# Patient Record
Sex: Male | Born: 1965 | Race: Black or African American | Hispanic: No | Marital: Single
Health system: Southern US, Community
[De-identification: ages and names within clinical notes are randomized; demographics above are authoritative.]

---

## 2009-10-27 ENCOUNTER — Encounter (INDEPENDENT_AMBULATORY_CARE_PROVIDER_SITE_OTHER): Payer: Self-pay | Admitting: Internal Medicine

## 2009-10-27 ENCOUNTER — Inpatient Hospital Stay (HOSPITAL_COMMUNITY): Admission: EM | Admit: 2009-10-27 | Discharge: 2009-10-29 | Payer: Self-pay | Admitting: Emergency Medicine

## 2009-10-27 ENCOUNTER — Ambulatory Visit: Payer: Self-pay | Admitting: Cardiovascular Disease

## 2010-09-23 LAB — COMPREHENSIVE METABOLIC PANEL
ALT: 27 U/L (ref 0–53)
AST: 27 U/L (ref 0–37)
Albumin: 3.6 g/dL (ref 3.5–5.2)
Alkaline Phosphatase: 61 U/L (ref 39–117)
Chloride: 106 mEq/L (ref 96–112)
Creatinine, Ser: 1.82 mg/dL — ABNORMAL HIGH (ref 0.4–1.5)
Total Bilirubin: 0.8 mg/dL (ref 0.3–1.2)

## 2010-09-23 LAB — BASIC METABOLIC PANEL
BUN: 17 mg/dL (ref 6–23)
CO2: 27 mEq/L (ref 19–32)
Calcium: 8.9 mg/dL (ref 8.4–10.5)
Calcium: 8.9 mg/dL (ref 8.4–10.5)
Chloride: 104 mEq/L (ref 96–112)
Creatinine, Ser: 1.68 mg/dL — ABNORMAL HIGH (ref 0.4–1.5)
GFR calc Af Amer: 54 mL/min — ABNORMAL LOW (ref >60)
GFR calc non Af Amer: 47 mL/min — ABNORMAL LOW (ref >60)
Glucose, Bld: 102 mg/dL — ABNORMAL HIGH (ref 70–99)
Glucose, Bld: 143 mg/dL — ABNORMAL HIGH (ref 70–99)
Potassium: 3.7 mEq/L (ref 3.5–5.1)
Sodium: 140 mEq/L (ref 135–145)
Sodium: 140 mEq/L (ref 135–145)

## 2010-09-23 LAB — DIFFERENTIAL
Basophils Relative: 1 % (ref 0–1)
Eosinophils Absolute: 0.4 10*3/uL (ref 0.0–0.7)
Eosinophils Relative: 6 % — ABNORMAL HIGH (ref 0–5)
Lymphs Abs: 2.4 10*3/uL (ref 0.7–4.0)
Monocytes Absolute: 0.7 10*3/uL (ref 0.1–1.0)
Monocytes Relative: 11 % (ref 3–12)
Neutro Abs: 2.4 10*3/uL (ref 1.7–7.7)
Neutrophils Relative %: 40 % — ABNORMAL LOW (ref 43–77)

## 2010-09-23 LAB — CBC
HCT: 43.4 % (ref 39.0–52.0)
MCV: 94.9 fL (ref 78.0–100.0)
MCV: 95.3 fL (ref 78.0–100.0)
Platelets: 135 10*3/uL — ABNORMAL LOW (ref 150–400)
Platelets: 138 10*3/uL — ABNORMAL LOW (ref 150–400)
RBC: 4.59 MIL/uL (ref 4.22–5.81)
RDW: 14.6 % (ref 11.5–15.5)
WBC: 6 10*3/uL (ref 4.0–10.5)
WBC: 7.6 10*3/uL (ref 4.0–10.5)

## 2010-09-23 LAB — CARDIAC PANEL(CRET KIN+CKTOT+MB+TROPI)
CK, MB: 3.6 ng/mL (ref 0.3–4.0)
CK, MB: 4 ng/mL (ref 0.3–4.0)
Total CK: 270 U/L — ABNORMAL HIGH (ref 7–232)
Troponin I: 0.03 ng/mL (ref 0.00–0.06)

## 2010-09-23 LAB — POCT CARDIAC MARKERS: Myoglobin, poc: 98.7 ng/mL (ref 12–200)

## 2010-09-23 LAB — LIPID PANEL
Cholesterol: 132 mg/dL (ref 0–200)
LDL Cholesterol: 77 mg/dL (ref 0–99)
Triglycerides: 40 mg/dL (ref <150)
VLDL: 8 mg/dL (ref 0–40)

## 2014-12-11 ENCOUNTER — Other Ambulatory Visit: Payer: Self-pay | Admitting: Family Medicine

## 2014-12-11 ENCOUNTER — Ambulatory Visit
Admission: RE | Admit: 2014-12-11 | Discharge: 2014-12-11 | Disposition: A | Discharge status: Home / Self Care | Payer: Self-pay | Source: Ambulatory Visit | Attending: Family Medicine | Admitting: Family Medicine

## 2014-12-11 DIAGNOSIS — M25531 Pain in right wrist: Secondary | ICD-10-CM

## 2014-12-11 DIAGNOSIS — M25532 Pain in left wrist: Secondary | ICD-10-CM

## 2015-02-07 ENCOUNTER — Encounter: Payer: Self-pay | Admitting: Occupational Therapy

## 2015-02-07 ENCOUNTER — Ambulatory Visit: Payer: 59 | Attending: Orthopedic Surgery | Admitting: Occupational Therapy

## 2015-02-07 DIAGNOSIS — M25531 Pain in right wrist: Secondary | ICD-10-CM | POA: Insufficient documentation

## 2015-02-07 DIAGNOSIS — R278 Other lack of coordination: Secondary | ICD-10-CM | POA: Diagnosis present

## 2015-02-07 DIAGNOSIS — M25532 Pain in left wrist: Secondary | ICD-10-CM | POA: Insufficient documentation

## 2015-02-07 NOTE — Therapy (Signed)
[facility] [address] [place], [place], [address] Phone: [phone]   Fax:  [phone]  Occupational Therapy Evaluation  Patient Details  Name: Roger Sheppard MRN: [id number] Date of Birth: 12/16/1965 Referring Provider:  Myrtie Neither, MD  Encounter Date: 02/07/2015      OT End of Session - 02/07/15 1229    Visit Number 1   Number of Visits 18   Authorization Type [employer]   OT Start Time 0930   OT Stop Time 1010   OT Time Calculation (min) 40 min   Activity Tolerance Patient tolerated treatment well      History reviewed. No pertinent past medical history.  History reviewed. No pertinent past surgical history.  There were no vitals filed for this visit.  Visit Diagnosis:  Pain in both wrists - Plan: Ot plan of care cert/re-cert  Coordination impairment - Plan: Ot plan of care cert/re-cert      Subjective Assessment - 02/07/15 0940    Currently in Pain? Yes   Pain Score 8   goes up to 10 with movement and lifting   Pain Location Wrist   Pain Orientation Right   Pain Descriptors / Indicators Stabbing   Pain Type Chronic pain   Pain Onset More than a month ago  3 months   Pain Frequency Intermittent   Aggravating Factors  lifting, moving; pt states he can also get intermittent swelling in wrist.   Pain Relieving Factors heat, rest, tens unit.  They didn't give me any pain meds. Pt states friend loaned him tens unit   Multiple Pain Sites Yes   Pain Score 6  increases to 10 with movement and lifting   Pain Location Wrist   Pain Orientation Left   Pain Descriptors / Indicators Stabbing   Pain Type Chronic pain   Pain Onset More than a month ago   Pain Frequency Intermittent   Aggravating Factors  lifting, moving   Pain Relieving Factors heat, tens, rest.            [facility] OT Assessment - 02/07/15 0001    Assessment   Diagnosis dequervain's bilateral wrists   Onset Date --  May  2016   Prior Therapy No prior therapies   Precautions   Precautions Other (comment)  pt reports MD stated no lifting beyond 10 pounds   Restrictions   Weight Bearing Restrictions No   Balance Screen   Has the patient fallen in the past 6 months No   Home  Environment   Family/patient expects to be discharged to: Private residence   Living Arrangements Alone   Type of Home House   Home Layout One level   Prior Function   Level of Independence Independent   Vocation Full time employment   Vocation Requirements works as  Administrator, 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   ADL   Eating/Feeding Modified independent  hard to cut and painful   Grooming Independent   Comptroller - Conservator, museum/gallery -  Tour manager Independent   IADL   Shopping Takes care of all shopping needs independently   Light Housekeeping Performs light daily tasks such as dishwashing, bed making  hard to do things that requires resistance or lifting  Meal Prep Plans, prepares and serves adequate meals independently  hard to meal prep due to pain   Engineer, agricultural independently on public transportation   Medication Management Is responsible for taking medication in correct dosages at correct time   Development worker, community financial matters independently (budgets, writes checks, pays rent, bills goes to bank), collects and keeps track of income   Mobility   Mobility Status Independent   Written Expression   Dominant Hand Right   Handwriting 100% legible   Sensation   Light Touch Appears Intact   Hot/Cold Appears Intact   Coordination   Gross Motor Movements are Fluid and Coordinated Yes   9 Hole Peg Test Right;Left   Right 9 Hole Peg Test 29.28   Left 9 Hole  Peg Test 35.36   Edema   Edema pt reports intermittent edema in right wrists intermittent   ROM / Strength   AROM / PROM / Strength AROM;Strength   AROM   Overall AROM  Within functional limits for tasks performed  positive test for dequarvains in left wrist only   Overall AROM Comments Pt reports signficant pain with radial and    Hand Function   Right Hand Gross Grasp Functional   Right Hand Grip (lbs) 95 pounds  increase pain   Left Hand Gross Grasp Functional   Left Hand Grip (lbs) 95 pounds  increased pain                           OT Short Term Goals - 02/07/15 1009    OT SHORT TERM GOAL #1   Title Pt will be mod I with HEP - 03/07/2015   Status New   OT SHORT TERM GOAL #2   Title Pt will report pain no greater than 5/10 with light activity   Status New   OT SHORT TERM GOAL #3   Title Pt will demonstrate improved coordination as evidenced by decrease in 9 hole peg test by 2 seconds (R= 29.28, L= 35.36)   Status New   OT SHORT TERM GOAL #4   Title Pt will report greater ease in using hands for cutting   Status New           OT Long Term Goals - 02/07/15 1013    OT LONG TERM GOAL #1   Title Pt will be mod I with upgraded HEP prn - 04/04/2015   Status New   OT LONG TERM GOAL #2   Title Pt will report no greater than 3/10 wrist pain with light activity   Status New   OT LONG TERM GOAL #3   Title Pt will demonstrate improved coordination as evidenced by decreasing time on 9 hole peg by 3 seconds (R= 29.28, L= 35.36)   Status New   OT LONG TERM GOAL #4   Title Pt will report ability to complete light household tasks with no greater than 3/10 wrist pain.   Status New               Plan - 02/07/15 1224    Clinical Impression Statement Pt is a 49 year old male with diagnosis of Dequarvain's bilateral wrist.  Pt presents today with the following deficits that impact his ability to complete ADL's  and IADL's without pain as well as inability to  return to work (pt works in Surveyor, quantity heavy boxes all day).:  significant pain in both wrists both at rest  and with activity, decreased coordination, at risk for potential loss of ROM due to pain, decreased functional use of both hands.   Rehab Potential Good   OT Frequency 2x / week   OT Duration 8 weeks   OT Treatment/Interventions Sheppard-care/ADL training;Biofeedback;Ultrasound;Traction;Moist Heat;Electrical Stimulation;Iontophoresis;Parrafin;Fluidtherapy;Contrast Bath;DME and/or AE instruction;Therapeutic exercise;Manual Therapy;Passive range of motion;Splinting;Therapeutic activities;Patient/family education   Plan focus on pain reduction, if possible initiate HEP   Consulted and Agree with Plan of Care Patient        Problem List There are no active problems to display for this patient.   Norton Pastel, OTR/L 02/07/2015, 12:33 PM  [facility] [address] [place], [place], [address] Phone: [phone]   Fax:  [phone]

## 2015-02-08 ENCOUNTER — Ambulatory Visit: Payer: 59 | Admitting: Occupational Therapy

## 2015-02-11 ENCOUNTER — Ambulatory Visit: Payer: 59 | Admitting: Occupational Therapy

## 2015-02-11 ENCOUNTER — Encounter: Payer: Self-pay | Admitting: Occupational Therapy

## 2015-02-11 DIAGNOSIS — M25531 Pain in right wrist: Secondary | ICD-10-CM

## 2015-02-11 DIAGNOSIS — M25532 Pain in left wrist: Secondary | ICD-10-CM

## 2015-02-11 DIAGNOSIS — R278 Other lack of coordination: Secondary | ICD-10-CM

## 2015-02-11 NOTE — Patient Instructions (Signed)
Home Program:  Work within your limits of pain. We are trying to work toward increased pain free range of motion and increased stability in your wrists.   Use heat prior to exercise and ice after.  Range of Motion: 1. Sit at a table. Turn hands so palms are facing each other. Do one hand at a time. DO THIS GENTLY!!  First flex wrist toward your belly then extend wrist away from your belly. Do 10 times. Do 1-2 times per day.   Wrist Stabilization exercises: 1. Do each hand separately.  Make sure you start with your wrist in neutral and elbow supported.  Using your other hand apply pressure with two fingers to top, then underneath, then each side.  Hold resistance for count of 5 for reach repetition.  Do 5 repetitions in each direction. Do this 3 times per day.

## 2015-02-11 NOTE — Therapy (Signed)
[facility] [address] [place], [place], [address] Phone: [phone]   Fax:  [phone]  Occupational Therapy Treatment  Patient Details  Name: Roger Sheppard MRN: [id number] Date of Birth: 12/22/1965 Referring Provider:  Myrtie Neither, MD  Encounter Date: 02/11/2015      OT End of Session - 02/11/15 1038    Visit Number 2   Number of Visits 16   Authorization Type [employer]   OT Start Time [phone]   OT Stop Time 0931   OT Time Calculation (min) 44 min   Activity Tolerance Patient tolerated treatment well      History reviewed. No pertinent past medical history.  History reviewed. No pertinent past surgical history.  There were no vitals filed for this visit.  Visit Diagnosis:  Pain in both wrists  Coordination impairment      Subjective Assessment - 02/11/15 0849    Subjective  The heat really helped the pain (after fluidotherapy)   Pertinent History see epic snapshot   Patient Stated Goals some help with my wrists   Currently in Pain? Yes   Pain Score 7    Pain Location Wrist   Pain Orientation Right;Left  both wrists   Pain Descriptors / Indicators Stabbing   Pain Type Chronic pain   Pain Onset More than a month ago   Pain Frequency Intermittent   Aggravating Factors  lifting ,moving, ptt sttes he can also get some intermitent swelling in wrist   Pain Relieving Factors heat, rest, tens unit.                       OT Treatments/Exercises (OP) - 02/11/15 0001    Exercises   Exercises Wrist   Wrist Exercises   Wrist Flexion PROM;AROM   Wrist Flexion Limitations Initially provided PROM to both wrists of flexion in gravity eliminated position to determine pain limitations. Pt able to tolerated. Followed by AROM in same position and pt instructed to complete gently and within pain limits. Pt able to tolerate   Wrist Extension PROM;AROM   Wrist Extension Limitations PROM to  both wrists in gravity eliminated position to determine pain tolerance. Then followed by AROM with instruction for gentle ROM and within pain limits. Pt able to tolerate.   Other wrist exercises PROM for radial and ulnar deviation within pain limits (approximately 75% of range) in both wrists however pt unable to tolerate AROM even with hands resting on table and sliding. Will defer until pain improves.    Other wrist exercises Pt tolerated wrist stabilization exercises to both wrists in all four planes, 5 reps x2.  Pt then instructed in wrist stabilization exercises as part of HEP and able to return demonstrate. Pt given HEP - see pt instructions for details. Pt  instructed to ice at end of session and at end of exercises to reduce inflammation   Modalities   Modalities Fluidotherapy   LUE Fluidotherapy   Number Minutes Fluidotherapy 12 Minutes   Comments Pt reported pain of 7/10 bilateral in wrists.  Fludio therapy to reduce pain and reduce tightness (right tighter than left) prior to exercises.  Pt reported that modality signficantly helped with pain and reported a drop in pain to 5/10   Manual Therapy   Manual therapy comments Joint mob and soft tissue release in both wrists prior to exercises. Pt with increasd tightness in wrist and hand in R hand.  OT Education - 02/11/15 1035    Education provided Yes   Education Details wrist stabilization, beginning ROM   Person(s) Educated Patient   Methods Explanation;Demonstration;Tactile cues;Verbal cues;Handout   Comprehension Verbalized understanding;Returned demonstration          OT Short Term Goals - 02/11/15 1036    OT SHORT TERM GOAL #1   Title Pt will be mod I with HEP - 03/07/2015   Status On-going   OT SHORT TERM GOAL #2   Title Pt will report pain no greater than 5/10 with light activity   Status On-going   OT SHORT TERM GOAL #3   Title Pt will demonstrate improved coordination as evidenced by decrease in 9  hole peg test by 2 seconds (R= 29.28, L= 35.36)   Status On-going   OT SHORT TERM GOAL #4   Title Pt will report greater ease in using hands for cutting   Status On-going           OT Long Term Goals - 02/11/15 1036    OT LONG TERM GOAL #1   Title Pt will be mod I with upgraded HEP prn - 04/04/2015   Status On-going   OT LONG TERM GOAL #2   Title Pt will report no greater than 3/10 wrist pain with light activity   Status On-going   OT LONG TERM GOAL #3   Title Pt will demonstrate improved coordination as evidenced by decreasing time on 9 hole peg by 3 seconds (R= 29.28, L= 35.36)   Status On-going   OT LONG TERM GOAL #4   Title Pt will report ability to complete light household tasks with no greater than 3/10 wrist pain.   Status On-going               Plan - 02/11/15 1036    Clinical Impression Statement Pt making progress toward goals. Pt responded well to fluidotherapy for pain reduction prior to exercise. Initiated HEP.   Rehab Potential Good   Clinical Impairments Affecting Rehab Potential pt can benefit from skilled OT to address clinical impairments noted in eval   OT Frequency 2x / week   OT Duration 8 weeks   OT Treatment/Interventions Self-care/ADL training;Biofeedback;Ultrasound;Traction;Moist Heat;Electrical Stimulation;Iontophoresis;Parrafin;Fluidtherapy;Contrast Bath;DME and/or AE instruction;Therapeutic exercise;Manual Therapy;Passive range of motion;Splinting;Therapeutic activities;Patient/family education   Plan fluido, check HEP, if pain reduces attempt weighted holds   Consulted and Agree with Plan of Care Patient        Problem List There are no active problems to display for this patient.   Norton Pastel, OTR/L 02/11/2015, 10:42 AM  [facility] [address] [place], [place], [address] Phone: [phone]   Fax:  [phone]

## 2015-02-15 ENCOUNTER — Ambulatory Visit: Payer: 59 | Admitting: Occupational Therapy

## 2015-02-15 DIAGNOSIS — M25531 Pain in right wrist: Secondary | ICD-10-CM | POA: Diagnosis not present

## 2015-02-15 DIAGNOSIS — M25532 Pain in left wrist: Secondary | ICD-10-CM

## 2015-02-15 DIAGNOSIS — R278 Other lack of coordination: Secondary | ICD-10-CM

## 2015-02-15 NOTE — Therapy (Signed)
[facility] [address] [place], [place], [address] Phone: [phone]   Fax:  [phone]  Occupational Therapy Treatment  Patient Details  Name: Roger Sheppard MRN: [id number] Date of Birth: [birth year] Referring Provider:  Myrtie Neither, MD  Encounter Date: 02/15/2015      OT End of Session - 02/15/15 1311    Visit Number 3   Number of Visits 16   Authorization Type [employer]   OT Start Time [phone]   OT Stop Time 0933   OT Time Calculation (min) 47 min   Activity Tolerance Patient tolerated treatment well   Behavior During Therapy [facility] for tasks assessed/performed      No past medical history on file.  No past surgical history on file.  There were no vitals filed for this visit.  Visit Diagnosis:  Pain in both wrists  Coordination impairment   Treatment:Self care:Therapist issued bilateral thumb spica splints and instructed pt in splint wear, care and precautions. Ultrasound , 0.8wcm2, 20% x 8 mins to bilateral wrists and thumbs. (Ice pack applied to left x 10 mins, no adverse reactions,t while therapist performed ultrasound on right wrist). A/ROM wrist flexion and ulnar deviation x10 reps each wrist, moving wrist without thumb, then pt stabilized wrist on tabletop and performed A/ROM thumb flexion/ extension, x 10 reps each hand.(Ice pack applied to right wrist x 5 mins while exercising LUE.no adverse reactions. Decreased pain at end of session. Order sent to MD for ionto, will proceed with ionto if MD signs and returns order.                           OT Short Term Goals - 02/11/15 1036    OT SHORT TERM GOAL #1   Title Pt will be mod I with HEP - 03/07/2015   Status On-going   OT SHORT TERM GOAL #2   Title Pt will report pain no greater than 5/10 with light activity   Status On-going   OT SHORT TERM GOAL #3   Title Pt will demonstrate improved coordination as evidenced  by decrease in 9 hole peg test by 2 seconds (R= 29.28, L= 35.36)   Status On-going   OT SHORT TERM GOAL #4   Title Pt will report greater ease in using hands for cutting   Status On-going           OT Long Term Goals - 02/11/15 1036    OT LONG TERM GOAL #1   Title Pt will be mod I with upgraded HEP prn - 04/04/2015   Status On-going   OT LONG TERM GOAL #2   Title Pt will report no greater than 3/10 wrist pain with light activity   Status On-going   OT LONG TERM GOAL #3   Title Pt will demonstrate improved coordination as evidenced by decreasing time on 9 hole peg by 3 seconds (R= 29.28, L= 35.36)   Status On-going   OT LONG TERM GOAL #4   Title Pt will report ability to complete light household tasks with no greater than 3/10 wrist pain.   Status On-going               Plan - 02/15/15 1305    Clinical Impression Statement Pt is limited by significant pain in bilateral wrists/ thumbs. Therapist sent a request to Dr Montez Morita for order for iontophoresis.   Rehab Potential Good   Clinical Impairments Affecting Rehab  Potential pt can benefit from skilled OT to address clinical impairments noted in eval   OT Frequency 2x / week   OT Duration 8 weeks   OT Treatment/Interventions Self-care/ADL training;Biofeedback;Ultrasound;Traction;Moist Heat;Electrical Stimulation;Iontophoresis;Parrafin;Fluidtherapy;Contrast Bath;DME and/or AE instruction;Therapeutic exercise;Manual Therapy;Passive range of motion;Splinting;Therapeutic activities;Patient/family education   Plan iontophoresis if order received from MD.   OT Home Exercise Plan issued A/ROM to wrist and thumb separately   Consulted and Agree with Plan of Care Patient        Problem List There are no active problems to display for this patient.   RINE,KATHRYN 02/15/2015, 1:11 PM Keene Breath, OTR/L [phone] Phone: [phone] 1:11 PM 02/15/2015 [facility] [address] [place], [place], [address] Phone: [phone]   Fax:  [phone]

## 2015-02-15 NOTE — Patient Instructions (Addendum)
Remove splints and exercise wrists and thumb separately 4x day Bend wrist both directions and to the side, keeping thumb still, 10 reps in pain free range. With wrist supported bend thumb in pain free range 10 reps 4x day. Use an ice pack in both wrists 8-10 mins 2x day. Wear wrist braces when not exercising or bathing. If pressure areas or redness from braces remove brace and bring to next visit

## 2015-02-19 ENCOUNTER — Ambulatory Visit: Payer: 59 | Admitting: Occupational Therapy

## 2015-02-22 ENCOUNTER — Ambulatory Visit: Payer: 59 | Admitting: Occupational Therapy

## 2015-02-22 DIAGNOSIS — M25531 Pain in right wrist: Secondary | ICD-10-CM | POA: Diagnosis not present

## 2015-02-22 DIAGNOSIS — M25532 Pain in left wrist: Secondary | ICD-10-CM

## 2015-02-22 DIAGNOSIS — R278 Other lack of coordination: Secondary | ICD-10-CM

## 2015-02-22 NOTE — Therapy (Addendum)
[facility] Rehabilitation [facility] [address] [place], [place], [address] Phone: [phone]   Fax:  [phone]  Occupational Therapy Treatment  Patient Details  Name: Roger Sheppard MRN: [id number] Date of Birth: 1966-02-16 Referring Provider:  Myrtie Neither, MD  Encounter Date: 02/22/2015      OT End of Session - 02/22/15 0925    Visit Number 4   Number of Visits 16   Authorization Type [employer]   OT Start Time [phone]   OT Stop Time 0930   OT Time Calculation (min) 34 min   Activity Tolerance Patient tolerated treatment well   Behavior During Therapy [facility] for tasks assessed/performed      No past medical history on file.  No past surgical history on file.  There were no vitals filed for this visit.  Visit Diagnosis:  Pain in bilateral wrists        Treatment: A/ROM wrist flexion/ extension and wrist ulnar deviation separate from thumb, followed by thumb flexion separate from wrist. Pt returned demonstration. Ice pack to wrist x 10 mins for pain relief, no adverse reactions Iontophoresis:1.5cc dexamethasone x 21 mins to left wrist, no adverse reactions, 4 small water blisters present. Bilateral Wrist pain is 6/10 today, movement aggravates, ice alleviates                     OT Short Term Goals - 02/11/15 1036    OT SHORT TERM GOAL #1   Title Pt will be mod I with HEP - 03/07/2015   Status On-going   OT SHORT TERM GOAL #2   Title Pt will report pain no greater than 5/10 with light activity   Status On-going   OT SHORT TERM GOAL #3   Title Pt will demonstrate improved coordination as evidenced by decrease in 9 hole peg test by 2 seconds (R= 29.28, L= 35.36)   Status On-going   OT SHORT TERM GOAL #4   Title Pt will report greater ease in using hands for cutting   Status On-going           OT Long Term Goals - 02/11/15 1036    OT LONG TERM GOAL #1   Title Pt will be mod I with upgraded HEP  prn - 04/04/2015   Status On-going   OT LONG TERM GOAL #2   Title Pt will report no greater than 3/10 wrist pain with light activity   Status On-going   OT LONG TERM GOAL #3   Title Pt will demonstrate improved coordination as evidenced by decreasing time on 9 hole peg by 3 seconds (R= 29.28, L= 35.36)   Status On-going   OT LONG TERM GOAL #4   Title Pt will report ability to complete light household tasks with no greater than 3/10 wrist pain.   Status On-going               Plan - 02/22/15 9528    Clinical Impression Statement Pt is progressing towards goals. Order rceived from Dr. Montez Morita for iontophoresis and therapist initiated for LUE.    Rehab Potential Good   Clinical Impairments Affecting Rehab Potential pt can benefit from skilled OT to address clinical impairments noted in eval   OT Frequency 2x / week   OT Duration 8 weeks   OT Treatment/Interventions Self-care/ADL training;Biofeedback;Ultrasound;Traction;Moist Heat;Electrical Stimulation;Iontophoresis;Parrafin;Fluidtherapy;Contrast Bath;DME and/or AE instruction;Therapeutic exercise;Manual Therapy;Passive range of motion;Splinting;Therapeutic activities;Patient/family education   OT Home Exercise Plan issued A/ROM to wrist and thumb  separately Plan: continue iontophoresis   Consulted and Agree with Plan of Care Patient        Problem List There are no active problems to display for this patient.   Ayline Dingus 02/22/2015, 9:26 AM Keene Breath, OTR/L [phone] Phone: [phone] 9:26 AM 02/22/2015 [facility] Out[facility] Rehabilitation [facility] [address] [place], [place], [address] Phone: [phone]   Fax:  [phone]

## 2015-02-26 ENCOUNTER — Ambulatory Visit: Payer: 59 | Admitting: Occupational Therapy

## 2015-02-26 ENCOUNTER — Telehealth: Payer: Self-pay | Admitting: Occupational Therapy

## 2015-02-26 NOTE — Telephone Encounter (Signed)
Spoke to pt and he reports that he missed appt by mistake.  Reminded him of his next scheduled appt and offered him a later appt time as he reports that has to drive a long distance to get here (was originally scheduled for 8:00 but moved to 11:00).

## 2015-02-28 ENCOUNTER — Ambulatory Visit: Payer: 59 | Admitting: Occupational Therapy

## 2015-02-28 DIAGNOSIS — M25531 Pain in right wrist: Secondary | ICD-10-CM

## 2015-02-28 DIAGNOSIS — M25532 Pain in left wrist: Secondary | ICD-10-CM

## 2015-02-28 NOTE — Therapy (Signed)
[facility] [address] [place], [place], [address] Phone: [phone]   Fax:  [phone]  Occupational Therapy Treatment  Patient Details  Name: Roger Sheppard MRN: [id number] Date of Birth: [birth year] Referring Provider:  Myrtie Neither, MD  Encounter Date: 02/28/2015      OT End of Session - 02/28/15 1142    Visit Number 5   Number of Visits 16   Authorization Type [employer]   OT Start Time 1122   OT Stop Time 1155   OT Time Calculation (min) 33 min   Activity Tolerance Patient tolerated treatment well   Behavior During Therapy [facility] for tasks assessed/performed      No past medical history on file.  No past surgical history on file.  There were no vitals filed for this visit.  Visit Diagnosis:  Pain in both wrists      Subjective Assessment - 02/28/15 1137    Subjective  Pt reports that he is currently not working  but was lifting 40-60lbs at work.  Pt reports that he had to mow but was wearing splints (zero turn riding lawnmower), but aggravated wrists.   Pertinent History see epic snapshot   Patient Stated Goals some help with my wrists   Currently in Pain? Yes   Pain Score 7    Pain Location Wrist   Pain Orientation Right;Left   Pain Descriptors / Indicators Sharp;Shooting   Pain Frequency Intermittent   Aggravating Factors  use/movement   Pain Relieving Factors ice                      OT Treatments/Exercises (OP) - 02/28/15 0001    Iontophoresis   Type of Iontophoresis Dexamethasone   Location left and right wrists   Dose 1.5 cc x 1.9-2.1 mA   Time 19 min each UE with no adverse reactions (22 min total, as started R then started L to run close to simultaneously)                OT Education - 02/28/15 1141    Education Details avoid combined wrist/thumb movement, repetitive gripping/pinching, maintain neutral wrist when possible, avoid heavy lifting,  adaptive strategies for lifting  in functional tasks, use of ice   Person(s) Educated Patient   Methods Explanation;Verbal cues   Comprehension Verbalized understanding          OT Short Term Goals - 02/11/15 1036    OT SHORT TERM GOAL #1   Title Pt will be mod I with HEP - 03/07/2015   Status On-going   OT SHORT TERM GOAL #2   Title Pt will report pain no greater than 5/10 with light activity   Status On-going   OT SHORT TERM GOAL #3   Title Pt will demonstrate improved coordination as evidenced by decrease in 9 hole peg test by 2 seconds (R= 29.28, L= 35.36)   Status On-going   OT SHORT TERM GOAL #4   Title Pt will report greater ease in using hands for cutting   Status On-going           OT Long Term Goals - 02/11/15 1036    OT LONG TERM GOAL #1   Title Pt will be mod I with upgraded HEP prn - 04/04/2015   Status On-going   OT LONG TERM GOAL #2   Title Pt will report no greater than 3/10 wrist pain with light activity   Status On-going  OT LONG TERM GOAL #3   Title Pt will demonstrate improved coordination as evidenced by decreasing time on 9 hole peg by 3 seconds (R= 29.28, L= 35.36)   Status On-going   OT LONG TERM GOAL #4   Title Pt will report ability to complete light household tasks with no greater than 3/10 wrist pain.   Status On-going               Plan - 02/28/15 1143    Clinical Impression Statement Pt arrived late; therefore, treatment time was decreased.  Initiated Iontophoresis to RUE and continue to LUE.  Pt verbalized understanding of positions to avoid.  Extend STGs to next week as pt hasn't been seen frequency and due to arriving late today.   Plan continue ionto to L and R wrists, review/update HEP prn, review/further educate in activity modifications prn.  Extend unmet STGs due to pt only seen for 5 visits.  Begin checking STGs next week.   OT Home Exercise Plan issued A/ROM to wrist and thumb separately   Consulted and Agree with Plan of  Care Patient        Problem List There are no active problems to display for this patient.   [facility] 02/28/2015, 12:52 PM  [facility] [address] [place], [place], [address] Phone: [phone]   Fax:  [phone]   Willa Frater, OTR/L 02/28/2015 12:52 PM

## 2015-03-05 ENCOUNTER — Ambulatory Visit: Payer: 59 | Admitting: Occupational Therapy

## 2015-03-06 ENCOUNTER — Ambulatory Visit: Payer: 59 | Admitting: Occupational Therapy

## 2015-03-28 ENCOUNTER — Encounter: Payer: Self-pay | Admitting: Occupational Therapy

## 2015-03-28 ENCOUNTER — Ambulatory Visit: Payer: 59 | Attending: Orthopedic Surgery | Admitting: Occupational Therapy

## 2015-03-28 DIAGNOSIS — M25531 Pain in right wrist: Secondary | ICD-10-CM | POA: Insufficient documentation

## 2015-03-28 DIAGNOSIS — M25532 Pain in left wrist: Secondary | ICD-10-CM | POA: Insufficient documentation

## 2015-03-28 NOTE — Therapy (Signed)
[facility] [address] [place], [place], [address] Phone: [phone]   Fax:  [phone]  Occupational Therapy Treatment  Patient Details  Name: Roger Sheppard MRN: [id number] Date of Birth: 11/15/1965 Referring Provider:  Marily Memos, MD  Encounter Date: 03/28/2015      OT End of Session - 03/28/15 0848    Visit Number 6   Number of Visits 16   Date for OT Re-Evaluation 04/27/15   Authorization Type [employer]   OT Start Time [phone]   OT Stop Time 0900   OT Time Calculation (min) 40 min   Activity Tolerance Patient tolerated treatment well      History reviewed. No pertinent past medical history.  History reviewed. No pertinent past surgical history.  There were no vitals filed for this visit.  Visit Diagnosis:  Pain in both wrists      Subjective Assessment - 03/28/15 0822    Subjective  I haven't been here because I had to bury my daughter. She was in a car crash   Pertinent History see epic snapshot   Patient Stated Goals some help with my wrists   Currently in Pain? Yes   Pain Score 6    Pain Location Wrist   Pain Orientation Right;Left   Pain Descriptors / Indicators Throbbing;Aching;Sharp   Pain Type Chronic pain   Pain Onset More than a month ago   Pain Frequency Intermittent   Aggravating Factors  use/movement   Pain Relieving Factors ice            [facility] OT Assessment - 03/28/15 0001    Coordination   Right 9 Hole Peg Test 25.97 sec. (using thumb and long finger)   Left 9 Hole Peg Test 31 sec.                   OT Treatments/Exercises (OP) - 03/28/15 0001    ADLs   ADL Comments Pt arrived 20 minutes late. Assessed STG's and progress to date   Iontophoresis   Type of Iontophoresis Dexamethasone   Location left and right wrists   Dose 1.5 cc x 2.1 mA   Time 19 min each UE with no adverse reactions (22 min today, as started Lt then started Rt). Time ran over  appointment time by 13 minutes                  OT Short Term Goals - 03/28/15 0847    OT SHORT TERM GOAL #1   Title Pt will be mod I with HEP - 03/07/2015   Status Achieved   OT SHORT TERM GOAL #2   Title Pt will report pain no greater than 5/10 with light activity   Status Not Met  6/10 bilateral wrists, can get up to 10/10   OT SHORT TERM GOAL #3   Title Pt will demonstrate improved coordination as evidenced by decrease in 9 hole peg test by 2 seconds (R= 29.28, L= 35.36)   Status Achieved  03/28/15: Rt = 25.97 sec. Lt = 31 sec.   OT SHORT TERM GOAL #4   Title Pt will report greater ease in using hands for cutting   Status On-going           OT Long Term Goals - 02/11/15 1036    OT LONG TERM GOAL #1   Title Pt will be mod I with upgraded HEP prn - 04/04/2015   Status On-going   OT LONG  TERM GOAL #2   Title Pt will report no greater than 3/10 wrist pain with light activity   Status On-going   OT LONG TERM GOAL #3   Title Pt will demonstrate improved coordination as evidenced by decreasing time on 9 hole peg by 3 seconds (R= 29.28, L= 35.36)   Status On-going   OT LONG TERM GOAL #4   Title Pt will report ability to complete light household tasks with no greater than 3/10 wrist pain.   Status On-going               Plan - 03/28/15 0849    Clinical Impression Statement Pt has not been seen for a month due to daughters death. Pt arrived 20 minutes late limiting therapy time. Pt met STG's #1 and #3   Plan continue ionto both wrists, review/update HEP prn, review/further educate in activity modifications prn (therapist extended LTG's for 1 month, secondary to not being seen for 4 weeks)   OT Home Exercise Plan issued A/ROM to wrist and thumb separately   Consulted and Agree with Plan of Care Patient        Problem List There are no active problems to display for this patient.   Carey Bullocks, OTR/L 03/28/2015, 8:52 AM  [facility] [address] [place], [place], [address] Phone: [phone]   Fax:  [phone]

## 2015-04-01 ENCOUNTER — Ambulatory Visit: Payer: 59 | Admitting: Occupational Therapy

## 2015-04-01 DIAGNOSIS — M25532 Pain in left wrist: Secondary | ICD-10-CM

## 2015-04-01 DIAGNOSIS — M25531 Pain in right wrist: Secondary | ICD-10-CM

## 2015-04-01 NOTE — Therapy (Signed)
[facility] [address] [place], [place], [address] Phone: [phone]   Fax:  [phone]  Occupational Therapy Treatment  Patient Details  Name: Roger Sheppard MRN: [id number] Date of Birth: 1966-03-14 Referring Provider:  Marily Memos, MD  Encounter Date: 04/01/2015      OT End of Session - 04/01/15 1250    Visit Number 7   Number of Visits 16   Date for OT Re-Evaluation 04/27/15   Authorization Type [place]   OT Start Time 1110   OT Stop Time 1158   OT Time Calculation (min) 48 min   Activity Tolerance Patient tolerated treatment well      No past medical history on file.  No past surgical history on file.  There were no vitals filed for this visit.  Visit Diagnosis:  Pain in both wrists      Subjective Assessment - 04/01/15 1114    Pertinent History see epic snapshot   Patient Stated Goals some help with my wrists   Currently in Pain? Yes   Pain Score 6    Pain Location Wrist   Pain Orientation Right;Left   Pain Descriptors / Indicators Throbbing;Sharp;Aching   Pain Type Chronic pain   Pain Onset More than a month ago   Pain Frequency Intermittent   Aggravating Factors  use/movement   Pain Relieving Factors ice, rest                      OT Treatments/Exercises (OP) - 04/01/15 0001    Iontophoresis   Type of Iontophoresis Dexamethasone   Location left and right wrists   Dose 1.5 cc x 2.1 mA   Time 19 min each UE with no adverse reactions (22 min today, as started Lt then started Rt). Time ran over appointment time by 13 minutes   Manual Therapy   Manual Therapy Soft tissue mobilization   Manual therapy comments Soft tissue mobs along length of tendon into muscle belly proximal radial forearm Lt, then Rt side.                   OT Short Term Goals - 03/28/15 0847    OT SHORT TERM GOAL #1   Title Pt will be mod I with HEP - 03/07/2015   Status Achieved    OT SHORT TERM GOAL #2   Title Pt will report pain no greater than 5/10 with light activity   Status Not Met  6/10 bilateral wrists, can get up to 10/10   OT SHORT TERM GOAL #3   Title Pt will demonstrate improved coordination as evidenced by decrease in 9 hole peg test by 2 seconds (R= 29.28, L= 35.36)   Status Achieved  03/28/15: Rt = 25.97 sec. Lt = 31 sec.   OT SHORT TERM GOAL #4   Title Pt will report greater ease in using hands for cutting   Status On-going           OT Long Term Goals - 02/11/15 1036    OT LONG TERM GOAL #1   Title Pt will be mod I with upgraded HEP prn - 04/04/2015   Status On-going   OT LONG TERM GOAL #2   Title Pt will report no greater than 3/10 wrist pain with light activity   Status On-going   OT LONG TERM GOAL #3   Title Pt will demonstrate improved coordination as evidenced by decreasing time on 9 hole peg by  3 seconds (R= 29.28, L= 35.36)   Status On-going   OT LONG TERM GOAL #4   Title Pt will report ability to complete light household tasks with no greater than 3/10 wrist pain.   Status On-going               Plan - 04/01/15 1251    Clinical Impression Statement Pt still reports higher pain with Lt worse than Rt wrist.    Plan continue ionto both wrists, review/further educate in activity modifications prn    OT Home Exercise Plan issued A/ROM to wrist and thumb separately   Consulted and Agree with Plan of Care Patient        Problem List There are no active problems to display for this patient.   Carey Bullocks, OTR/L 04/01/2015, 1:26 PM  [facility] [address] [place], [place], [address] Phone: [phone]   Fax:  [phone]

## 2015-04-03 ENCOUNTER — Ambulatory Visit: Payer: 59 | Admitting: Occupational Therapy

## 2015-04-09 ENCOUNTER — Ambulatory Visit: Payer: 59 | Attending: Orthopedic Surgery | Admitting: Occupational Therapy

## 2015-04-09 DIAGNOSIS — M25531 Pain in right wrist: Secondary | ICD-10-CM | POA: Insufficient documentation

## 2015-04-09 DIAGNOSIS — R278 Other lack of coordination: Secondary | ICD-10-CM | POA: Insufficient documentation

## 2015-04-09 DIAGNOSIS — M25532 Pain in left wrist: Secondary | ICD-10-CM | POA: Insufficient documentation

## 2015-04-11 ENCOUNTER — Ambulatory Visit: Payer: 59 | Admitting: Occupational Therapy

## 2015-04-11 DIAGNOSIS — M25531 Pain in right wrist: Secondary | ICD-10-CM | POA: Diagnosis present

## 2015-04-11 DIAGNOSIS — R278 Other lack of coordination: Secondary | ICD-10-CM | POA: Diagnosis present

## 2015-04-11 DIAGNOSIS — M25532 Pain in left wrist: Secondary | ICD-10-CM | POA: Diagnosis present

## 2015-04-11 NOTE — Therapy (Signed)
[facility] [address] [place], [place], [address] Phone: [phone]   Fax:  [phone]  Occupational Therapy Treatment  Patient Details  Name: Roger Sheppard MRN: [id number] Date of Birth: 1965-10-03 Referring Provider:  Marily Memos, MD  Encounter Date: 04/11/2015      OT End of Session - 04/11/15 0929    Visit Number 8   Number of Visits 16   Date for OT Re-Evaluation 04/27/15   Authorization Type [place] Time Period week 6/8   OT Start Time 0850   OT Stop Time 0935   OT Time Calculation (min) 45 min   Activity Tolerance Patient tolerated treatment well   Behavior During Therapy [facility] for tasks assessed/performed      No past medical history on file.  No past surgical history on file.  There were no vitals filed for this visit.  Visit Diagnosis:  Pain in both wrists  Coordination impairment      Subjective Assessment - 04/11/15 0853    Subjective  "I can't hold my granson due to pain"   Pertinent History see epic snapshot   Patient Stated Goals some help with my wrists   Currently in Pain? Yes   Pain Score 6    Pain Location Wrist   Pain Orientation Right;Left   Pain Descriptors / Indicators Sharp   Aggravating Factors  use/movement   Pain Relieving Factors ice, rest                      OT Treatments/Exercises (OP) - 04/11/15 0001    ADLs   ADL Education Given --  Instructed in Activity modifications while ice pack on wrists   Exercises   Exercises Wrist   Wrist Exercises   Other wrist exercises Briefly reviewed exercises:  wrist flex/ext AROM, isolated AROM thumb flex/ext, isolated wrist UD.  Pt returned demo each.   Cryotherapy   Number Minutes Cryotherapy 8 Minutes   Cryotherapy Location Wrist  BUEs   Type of Cryotherapy Ice pack  with no adverse reactions   Iontophoresis   Type of Iontophoresis Dexamethasone   Location left (Ionto #5)  and right (Ionto #4) wrists   Dose 1.5 cc x 2.1 mA   Time 19 min each UE with no adverse reactions (1-2 small water blisters present at each medication site),  22 min totaltoday, as started Lt then started Rt   Manual Therapy   Manual Therapy Soft tissue mobilization   Manual therapy comments Soft tissue mobs along length of tendon into muscle belly proximal radial forearm Lt, then Rt side.                 OT Education - 04/11/15 [phone]    Education Details Reviewed activity modifications:  how to hold/carry objects (neutral wrist/thumb), avoid combined wrist/thumb movement, neutral wrist as much as possible, avoid repetitive gripping/pinching, use of ice, wear splint at all times except bathing/HEP due to continued pain   Person(s) Educated Patient   Methods Explanation;Verbal cues;Demonstration   Comprehension Verbalized understanding          OT Short Term Goals - 03/28/15 0847    OT SHORT TERM GOAL #1   Title Pt will be mod I with HEP - 03/07/2015   Status Achieved   OT SHORT TERM GOAL #2   Title Pt will report pain no greater than 5/10 with light activity   Status Not Met  6/10 bilateral  wrists, can get up to 10/10   OT SHORT TERM GOAL #3   Title Pt will demonstrate improved coordination as evidenced by decrease in 9 hole peg test by 2 seconds (R= 29.28, L= 35.36)   Status Achieved  03/28/15: Rt = 25.97 sec. Lt = 31 sec.   OT SHORT TERM GOAL #4   Title Pt will report greater ease in using hands for cutting   Status On-going           OT Long Term Goals - 04/11/15 0932    OT LONG TERM GOAL #1   Title Pt will be mod I with upgraded HEP prn - 04/04/2015, extend through 04/26/15 due to missed visits   Status On-going   OT LONG TERM GOAL #2   Title Pt will report no greater than 3/10 wrist pain with light activity   Status On-going   OT LONG TERM GOAL #3   Title Pt will demonstrate improved coordination as evidenced by decreasing time on 9 hole peg by 3 seconds (R=  29.28, L= 35.36)   Status On-going   OT LONG TERM GOAL #4   Title Pt will report ability to complete light household tasks with no greater than 3/10 wrist pain.   Status On-going               Plan - 04/11/15 0929    Clinical Impression Statement Pt reports continued sharp pain with LUE worse than RUE.  Missed appt earlier this week due to mixing up day.  Emphasized importance on attendance for incr Ionto effectiveness.   Pt reports that he is not wearing splint all the time.  Emphasized incr splint wear, activity modifications, and rest due to continued pain.   Plan continue Ionto to both wrists   OT Home Exercise Plan issued A/ROM to wrist and thumb separately   Consulted and Agree with Plan of Care Patient        Problem List There are no active problems to display for this patient.   [facility] 04/11/2015, 10:02 AM  [facility] [address] [place], [place], [address] Phone: [phone]   Fax:  [place], OTR/L 04/11/2015 10:02 AM

## 2015-04-16 ENCOUNTER — Ambulatory Visit: Payer: 59 | Admitting: Occupational Therapy

## 2015-04-18 ENCOUNTER — Ambulatory Visit: Payer: 59 | Admitting: Occupational Therapy

## 2015-04-23 ENCOUNTER — Ambulatory Visit: Payer: 59 | Admitting: Occupational Therapy

## 2015-04-25 ENCOUNTER — Encounter: Payer: 59 | Admitting: Occupational Therapy

## 2015-04-25 ENCOUNTER — Encounter: Payer: Self-pay | Admitting: Occupational Therapy

## 2015-04-25 ENCOUNTER — Ambulatory Visit: Payer: 59 | Admitting: Occupational Therapy

## 2015-04-25 DIAGNOSIS — M25532 Pain in left wrist: Secondary | ICD-10-CM

## 2015-04-25 DIAGNOSIS — M25531 Pain in right wrist: Secondary | ICD-10-CM

## 2015-04-25 DIAGNOSIS — R278 Other lack of coordination: Secondary | ICD-10-CM

## 2015-04-25 NOTE — Therapy (Signed)
[facility] [address] [place], [place], [address] Phone: [phone]   Fax:  [phone]  Occupational Therapy Treatment  Patient Details  Name: Roger Sheppard MRN: [id number] Date of Birth: 11/28/1965 No Data Recorded  Encounter Date: 04/25/2015      OT End of Session - 04/25/15 1727    Visit Number 9   Number of Visits 16   Date for OT Re-Evaluation 04/27/15   Authorization Type [place] Time Period week 7/8   OT Start Time 1018   OT Stop Time 1100   OT Time Calculation (min) 42 min   Activity Tolerance Patient tolerated treatment well   Behavior During Therapy [facility] for tasks assessed/performed      History reviewed. No pertinent past medical history.  History reviewed. No pertinent past surgical history.  There were no vitals filed for this visit.  Visit Diagnosis:  No diagnosis found.      Subjective Assessment - 04/25/15 1022    Subjective  Pain may be a [place] better   Pertinent History see epic snapshot   Patient Stated Goals some help with my wrists   Currently in Pain? Yes   Pain Score 4    Pain Location Wrist   Pain Orientation Right;Left   Pain Descriptors / Indicators Sharp   Pain Type Chronic pain   Pain Frequency Intermittent   Aggravating Factors  use/movement   Pain Relieving Factors ice, rest                      OT Treatments/Exercises (OP) - 04/25/15 0001    ADLs   ADL Comments checked goals (see goals section) and discussed progress and limitations (decreased attendance, distance needed to travel to OT in addition to incr pain with driving).  Discussed plan to d/c with follow up with MD next week.  Pt reports that MD plans to refer him to hand specialist.  Discussed that if hand specialist recommends further OT, it would be better for pt to have site closer to his home for convinence and to help improve consistency with  attendance/treatment and reduce driving demand (as pt reports driving long distances increases pain).   Searched internet with pt to locate 2 possible/appropriate sites that treat hand conditions for therapy closer to where he lives if further therapy needed/recommended by physician (and printed out contact info/hours for sites).  Recommended pt continue with HEP, splint wear, activity modifications.  Pt verbalized understanding/agreement with plan.   Cryotherapy   Number Minutes Cryotherapy 8 Minutes  with no adverse reactions   Cryotherapy Location Wrist  BUEs   Type of Cryotherapy Ice pack;Ice massage  ice pack to R wrist while ice massage to L wrist   Manual Therapy   Manual Therapy Soft tissue mobilization   Manual therapy comments Soft tissue mobs along length of tendon into muscle belly proximal radial forearm Lt, then Rt side.  Followed by gentle PROM wrist flex/ext and UD within pt tolerance.                  OT Short Term Goals - 04/25/15 1025    OT SHORT TERM GOAL #1   Title Pt will be mod I with HEP - 03/07/2015   Status Achieved   OT SHORT TERM GOAL #2   Title Pt will report pain no greater than 5/10 with light activity   Status Not Met  6/10 bilateral wrists, can get  up to 10/10 (same 04/25/15)   OT SHORT TERM GOAL #3   Title Pt will demonstrate improved coordination as evidenced by decrease in 9 hole peg test by 2 seconds (R= 29.28, L= 35.36)   Status Achieved  03/28/15: Rt = 25.97 sec. Lt = 31 sec.   OT SHORT TERM GOAL #4   Title Pt will report greater ease in using hands for cutting   Status Not Met  per pt            OT Long Term Goals - 04/25/15 1026    OT LONG TERM GOAL #1   Title Pt will be mod I with upgraded HEP prn - 04/04/2015, extend through 04/26/15 due to missed visits   Status Not Met  Not met 04/25/15  current HEP remains appropriate due to continued pt (pt cannot tolerate progression of HEP)   OT LONG TERM GOAL #2   Title Pt will report  no greater than 3/10 wrist pain with light activity   Status Not Met  up to 9-10/10 at times   OT LONG TERM GOAL #3   Title Pt will demonstrate improved coordination as evidenced by decreasing time on 9 hole peg by 3 seconds (R= 29.28, L= 35.36)   Status Achieved  9-hole peg test:  R-23.87sec, L-27.38sec   OT LONG TERM GOAL #4   Title Pt will report ability to complete light household tasks with no greater than 3/10 wrist pain.   Status Not Met  up to 9-10/10 at times               Plan - 04/25/15 1730    Clinical Impression Statement Pt continues to have significant pain and is unable to tolerate progression of HEP.  Inconsistent attendance may have impacted effectiveness of Iontophoresis.  Recommended d/c at this time due to decr progress.  Pt to follow up with MD next week and reports that MD wanted to refer him to hand specialist.   If physician feels that further OT is needed in the future, recommended that pt attend location that is closer to home as pt has to drive >4WN to therapy affecting attendance.  Also pt reports incr pain with driving that distance and is unable to wear splints.  Pt verbalizes understanding and agrees with plan.   Plan d/c OT  (see clinical impression above)   OT Home Exercise Plan issued A/ROM to wrist and thumb separately   Consulted and Agree with Plan of Care Patient       OCCUPATIONAL THERAPY DISCHARGE SUMMARY  Visits from Start of Care: 9  Current functional level related to goals / functional outcomes: See above   Remaining deficits: Pain, decreased bilateral hand functional use   Education / Equipment: Pt instructed in initial HEP, activity modifications to decr pain, splint wear/care (bilateral forearm based thumb spica splints).  Pt verbalized understanding of education provided.  Plan: Patient agrees to discharge.  Patient goals were partially met. Patient is being discharged due to lack of progress. and inconsistent attendance. (see  above) ?????         Problem List There are no active problems to display for this patient.   [facility] 04/25/2015, 5:46 PM  [facility] [address] [place], [place], [address] Phone: [phone]   Fax:  [phone]  Name: Roger Sheppard MRN: [id number]   Date of Birth: [birth year] Vianne Bulls, OTR/L 04/25/2015 5:46 PM

## 2016-05-05 ENCOUNTER — Telehealth (INDEPENDENT_AMBULATORY_CARE_PROVIDER_SITE_OTHER): Payer: Self-pay | Admitting: *Deleted

## 2016-05-05 NOTE — Telephone Encounter (Signed)
[employer] called to see if we received fax for long term disability. Call back number is [phone] [facility]

## 2016-05-06 NOTE — Telephone Encounter (Signed)
I have not received anything yet, lmom we have not received dis form.

## 2017-02-04 IMAGING — CR DG WRIST COMPLETE 3+V*R*
4 series · 4 of 4 positions shown · non-contrast
Comparison: None.

CLINICAL DATA: Complains of bilateral wrist pain. Especially
painful in the scaphoid position.

EXAM:
RIGHT WRIST - COMPLETE 3+ VIEW

[x wrist pa right (1 of 2)]
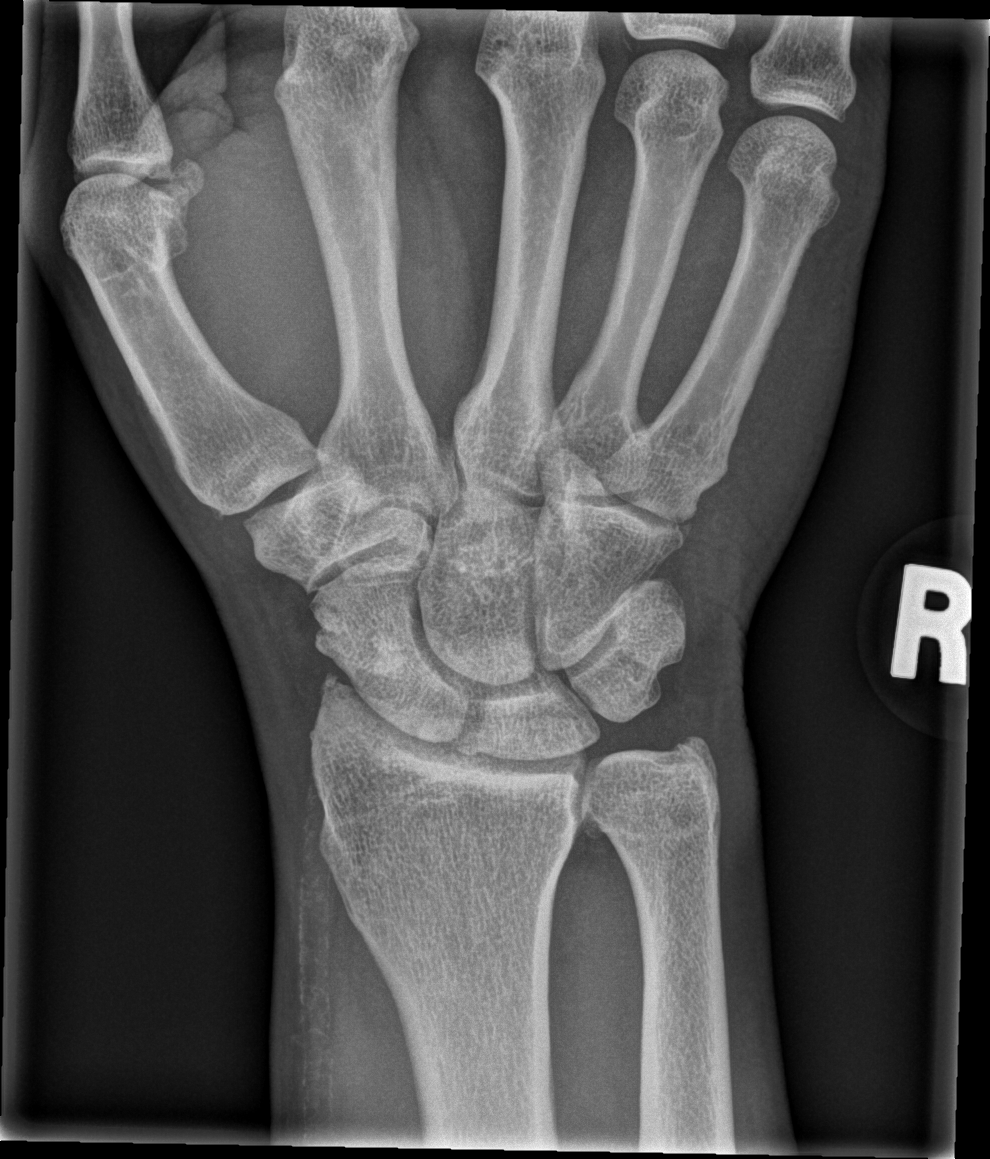

[x wrist pa right (2 of 2)]
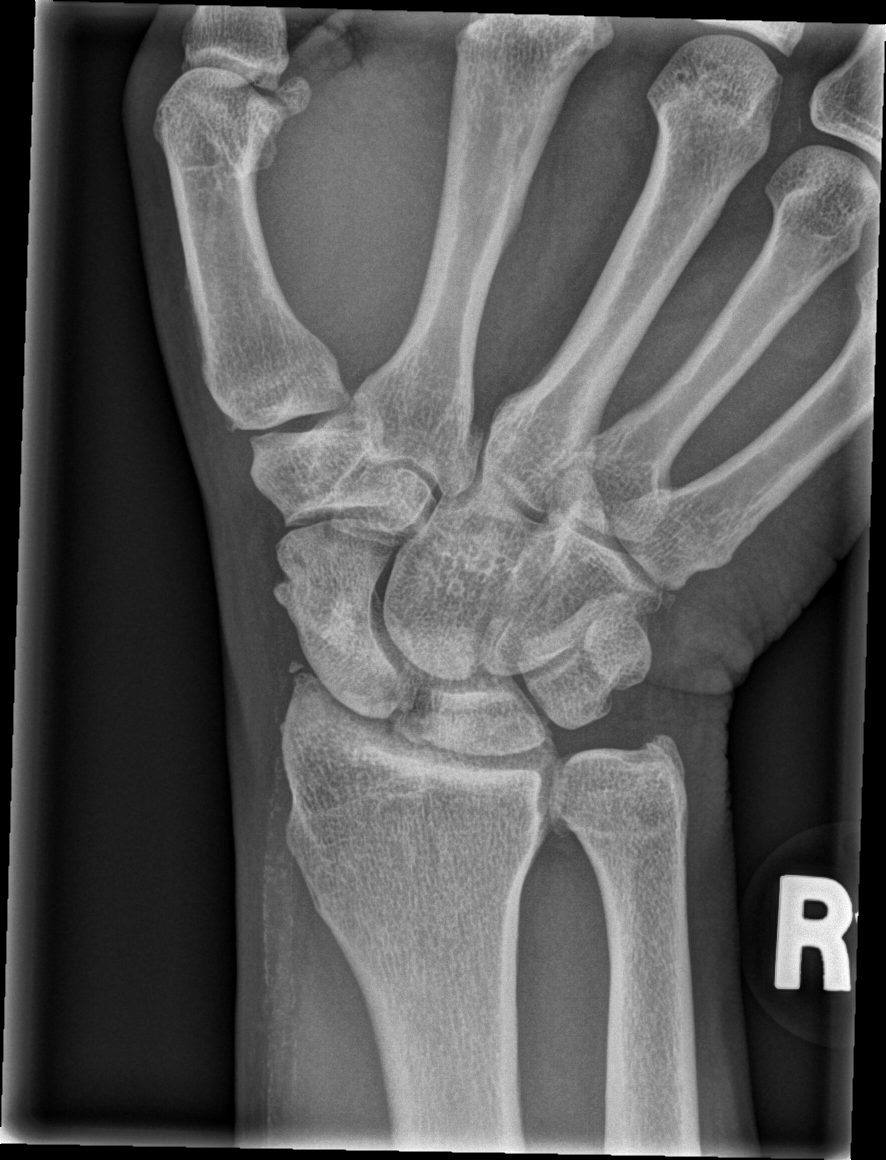

[x wrist obl right]
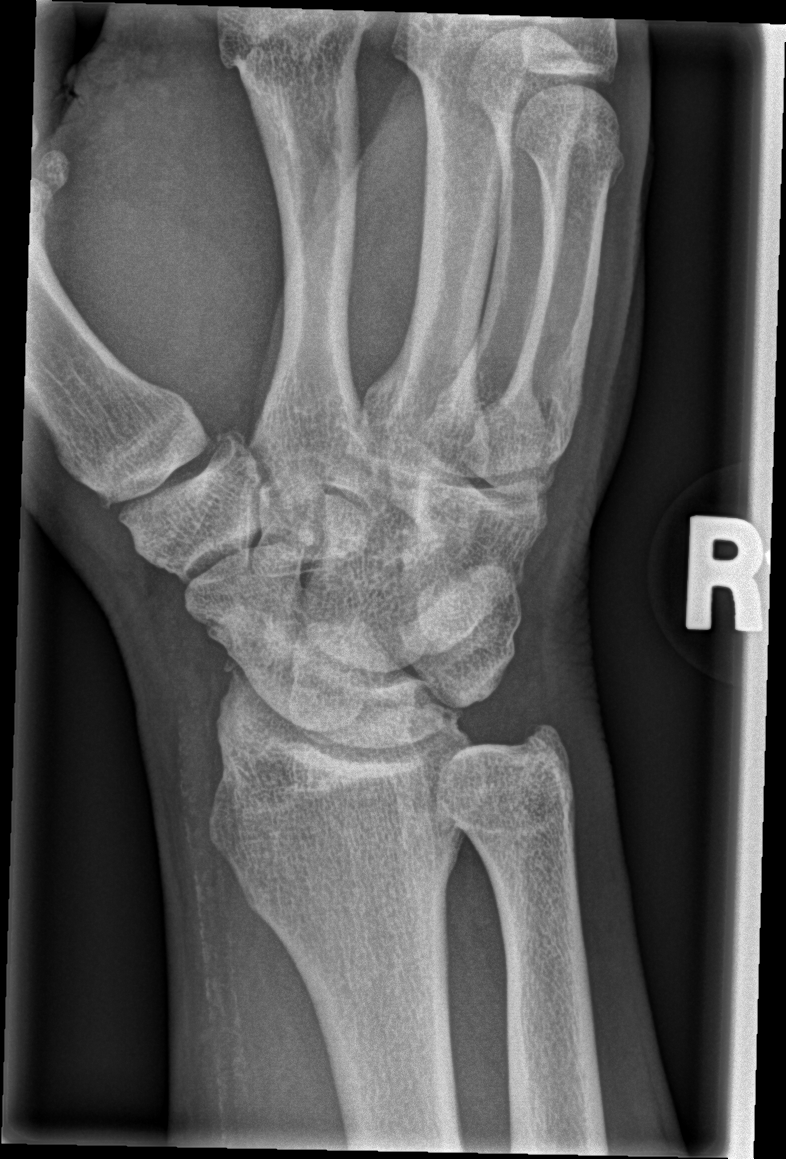

[x wrist lat right]
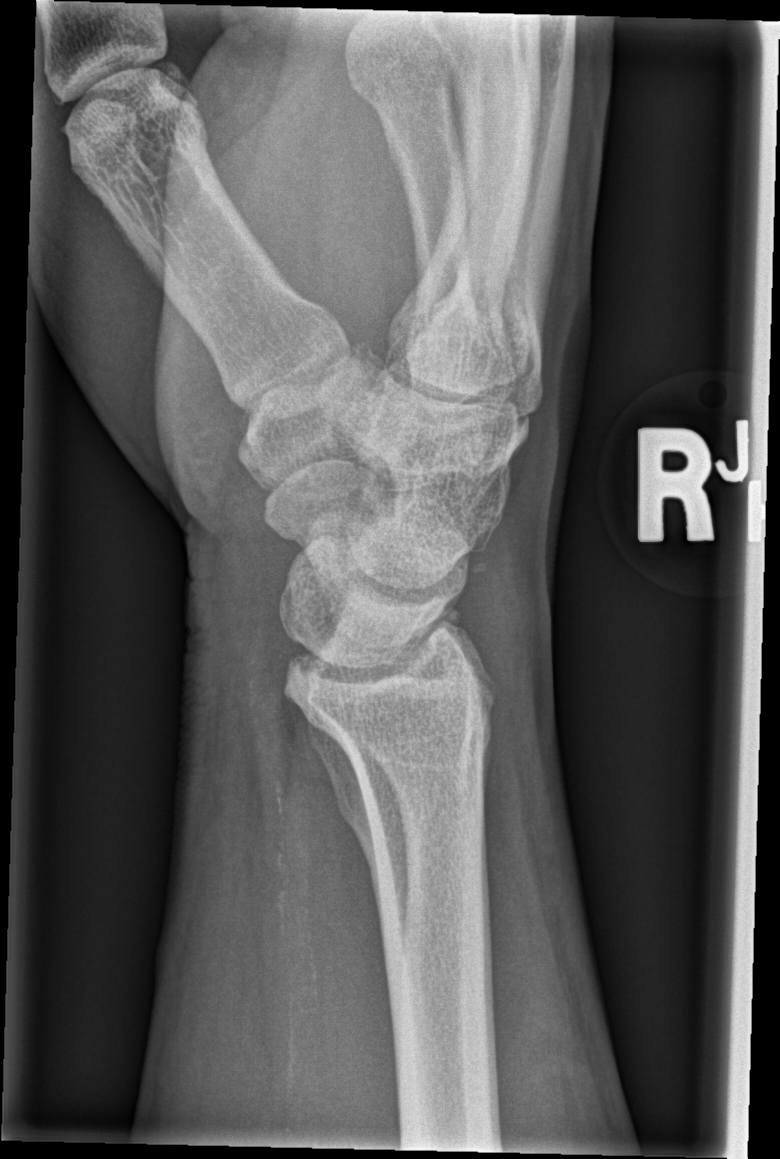

[4 of 4 positions shown; findings below may reference images not displayed]

FINDINGS: Degenerative changes are noted at the radiocarpal joint. There is
also mild degenerative change at the distal radial ulnar joint. No
acute fracture or subluxation identified. Vascular calcifications
are noted.
IMPRESSION: 1. Degenerative joint disease as above.
2. No acute findings.

## 2017-06-22 ENCOUNTER — Telehealth (INDEPENDENT_AMBULATORY_CARE_PROVIDER_SITE_OTHER): Payer: Self-pay

## 2017-06-22 NOTE — Telephone Encounter (Signed)
Ruby with [employer] of [employer] wanted to know if forms had been received for patient.  CB# is [phone].  Please advise.
